# Patient Record
Sex: Male | Born: 1985 | Race: Black or African American | Hispanic: No | Marital: Married | Smoking: Never smoker
Health system: Southern US, Community
[De-identification: ages and names within clinical notes are randomized; demographics above are authoritative.]

## PROBLEM LIST (undated history)

## (undated) HISTORY — PX: HERNIA REPAIR: SHX51

## (undated) HISTORY — PX: FOOT SURGERY: SHX648

---
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
## 2010-09-28 ENCOUNTER — Emergency Department (HOSPITAL_COMMUNITY): Admission: EM | Admit: 2010-09-28 | Discharge: 2010-07-18 | Payer: Self-pay | Admitting: Emergency Medicine

## 2011-02-02 ENCOUNTER — Emergency Department (HOSPITAL_COMMUNITY)
Admission: EM | Admit: 2011-02-02 | Discharge: 2011-02-02 | Discharge status: Against Medical Advice | Payer: Self-pay | Attending: Emergency Medicine | Admitting: Emergency Medicine

## 2011-02-02 ENCOUNTER — Emergency Department (HOSPITAL_COMMUNITY): Payer: Self-pay

## 2011-02-02 DIAGNOSIS — W219XXA Striking against or struck by unspecified sports equipment, initial encounter: Secondary | ICD-10-CM | POA: Insufficient documentation

## 2011-02-02 DIAGNOSIS — S0993XA Unspecified injury of face, initial encounter: Secondary | ICD-10-CM | POA: Insufficient documentation

## 2011-02-02 DIAGNOSIS — S199XXA Unspecified injury of neck, initial encounter: Secondary | ICD-10-CM | POA: Insufficient documentation

## 2011-02-02 DIAGNOSIS — Y9367 Activity, basketball: Secondary | ICD-10-CM | POA: Insufficient documentation

## 2011-02-03 ENCOUNTER — Emergency Department (HOSPITAL_COMMUNITY): Payer: Self-pay

## 2011-02-03 ENCOUNTER — Emergency Department (HOSPITAL_COMMUNITY)
Admission: EM | Admit: 2011-02-03 | Discharge: 2011-02-03 | Disposition: A | Discharge status: Home / Self Care | Payer: Self-pay | Attending: Emergency Medicine | Admitting: Emergency Medicine

## 2011-02-03 DIAGNOSIS — J3489 Other specified disorders of nose and nasal sinuses: Secondary | ICD-10-CM | POA: Insufficient documentation

## 2011-02-03 DIAGNOSIS — W219XXA Striking against or struck by unspecified sports equipment, initial encounter: Secondary | ICD-10-CM | POA: Insufficient documentation

## 2011-02-03 DIAGNOSIS — Y929 Unspecified place or not applicable: Secondary | ICD-10-CM | POA: Insufficient documentation

## 2011-02-03 DIAGNOSIS — R51 Headache: Secondary | ICD-10-CM | POA: Insufficient documentation

## 2011-02-03 DIAGNOSIS — R220 Localized swelling, mass and lump, head: Secondary | ICD-10-CM | POA: Insufficient documentation

## 2011-02-03 DIAGNOSIS — S022XXA Fracture of nasal bones, initial encounter for closed fracture: Secondary | ICD-10-CM | POA: Insufficient documentation

## 2011-02-03 DIAGNOSIS — J45909 Unspecified asthma, uncomplicated: Secondary | ICD-10-CM | POA: Insufficient documentation

## 2011-02-03 DIAGNOSIS — Y9367 Activity, basketball: Secondary | ICD-10-CM | POA: Insufficient documentation

## 2011-03-09 NOTE — Op Note (Signed)
NAMEODES, LOLLI              ACCOUNT NO.:  [id number]   MEDICAL RECORD NO.:  [id number]          PATIENT TYPE:  AMB   LOCATION:  DSC                          FACILITY:  [facility]   PHYSICIAN:  Robert A. Thurston Hole, M.D. DATE OF BIRTH:  02/22/1986   DATE OF PROCEDURE:  10/30/2005  DATE OF DISCHARGE:                                 OPERATIVE REPORT   PREOPERATIVE DIAGNOSIS:  Right fifth metatarsal fracture.   POSTOPERATIVE DIAGNOSIS:  Right fifth metatarsal fracture.   PROCEDURE:  Open reduction internal fixation of right fifth metatarsal  fracture.   SURGEON:  Dr. Salvatore Marvel   ANESTHESIA:  General.   OPERATIVE TIME:  40 minutes.   COMPLICATIONS:  None.   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 He is now to undergo ORIF of this.   DESCRIPTION:  Slayton was brought to operating room on October 30, 2005,  placed on operative table in supine position. After adequate level of  general anesthesia was obtained, he received Ancef 1 gram IV preoperatively  for prophylaxis. His right foot and leg was prepped using sterile DuraPrep  and draped using sterile technique.  Using fluoroscopic guidance through a 5  mm proximal incision at the base of fifth metatarsal, initial exposure was  made.  The underlying subcutaneous tissues were incised longitudinally. The  guide pin from the cannulated 4.5 mm screw set was placed through the tip of  the fifth metatarsal and under fluoroscopic control, it was drilled distally  down the fifth metatarsal shaft across the fracture site and into the fifth  metatarsal shaft distally. AP and lateral fluoroscopic x-rays confirmed  satisfactory position of the pin. The pin was measured, found to be 60 mm in  length and then was overdrilled with a 2.7 mm drill and then a 60 mm x 4.5  mm cannulated screw was  screwed into position with a firm and tight  fixation, holding the fracture in a reduced and anatomic position. AP and  lateral fluoroscopic x-rays confirmed this. After this was done, the pin was  removed. The wound irrigated and closed with interrupted 4-0 nylon suture  and injected with 0.25%  Marcaine. Sterile dressings were applied. The  patient awakened and taken to recovery in stable condition.   FOLLOW-UP CARE:  Asmar will be followed as an outpatient on Vicodin for  pain. See him back in the office in a week for sutures out and follow-up.      Robert A. Thurston Hole, M.D.  Electronically Signed     RAW/MEDQ  D:  10/30/2005  T:  10/31/2005  Job:  [id number]

## 2013-07-16 ENCOUNTER — Encounter (HOSPITAL_COMMUNITY): Payer: Self-pay | Admitting: Nurse Practitioner

## 2013-07-16 ENCOUNTER — Emergency Department (HOSPITAL_COMMUNITY): Payer: Self-pay

## 2013-07-16 ENCOUNTER — Emergency Department (HOSPITAL_COMMUNITY)
Admission: EM | Admit: 2013-07-16 | Discharge: 2013-07-16 | Disposition: A | Discharge status: Home / Self Care | Payer: Self-pay | Attending: Emergency Medicine | Admitting: Emergency Medicine

## 2013-07-16 DIAGNOSIS — X500XXA Overexertion from strenuous movement or load, initial encounter: Secondary | ICD-10-CM | POA: Insufficient documentation

## 2013-07-16 DIAGNOSIS — Y9367 Activity, basketball: Secondary | ICD-10-CM | POA: Insufficient documentation

## 2013-07-16 DIAGNOSIS — S8992XA Unspecified injury of left lower leg, initial encounter: Secondary | ICD-10-CM

## 2013-07-16 DIAGNOSIS — S8990XA Unspecified injury of unspecified lower leg, initial encounter: Secondary | ICD-10-CM | POA: Insufficient documentation

## 2013-07-16 DIAGNOSIS — Y9239 Other specified sports and athletic area as the place of occurrence of the external cause: Secondary | ICD-10-CM | POA: Insufficient documentation

## 2013-07-16 MED ORDER — HYDROCODONE-ACETAMINOPHEN 5-325 MG PO TABS: 1.0000 | ORAL_TABLET | ORAL | Status: DC | PRN

## 2013-07-16 MED ORDER — NAPROXEN 375 MG PO TABS: 375.0000 mg | ORAL_TABLET | Freq: Two times a day (BID) | ORAL | Status: DC

## 2013-07-16 NOTE — ED Provider Notes (Signed)
CSN: [id number]     Arrival date & time 07/16/13  1716 History  This chart was scribed for non-physician practitioner, Marlon Pel, PA-C working with Junius Argyle, MD by Greggory Stallion, ED scribe. This patient was seen in room TR11C/TR11C and the patient's care was started at 7:05 PM.   Chief Complaint  Patient presents with  . Knee Injury   The history is provided by the patient. No language interpreter was used.    HPI Comments: Sergio Wilson is a 27 y.o. male who presents to the Emergency Department complaining of left knee injury that occurred this morning. Pt states he was playing basketball when his left knee popped to the side and has been hurting since then. He states ambulation and bending worsens the pain. Pt has put on a compression wrap and used ice with mild relief. He denies any other associated symptoms.   History reviewed. No pertinent past medical history. History reviewed. No pertinent past surgical history. History reviewed. No pertinent family history. History  Substance Use Topics  . Smoking status: Never Smoker   . Smokeless tobacco: Not on file  . Alcohol Use: No    Review of Systems  Musculoskeletal: Positive for arthralgias.  All other systems reviewed and are negative.    Allergies  Review of patient's allergies indicates no known allergies.  Home Medications   Current Outpatient Rx  Name  Route  Sig  Dispense  Refill  . ibuprofen (ADVIL,MOTRIN) 200 MG tablet   Oral   Take 200 mg by mouth every 6 (six) hours as needed for pain.         . Tetrahydrozoline HCl (VISINE OP)   Both Eyes   Place 2 drops into both eyes as needed (red eyes).         Marland Kitchen HYDROcodone-acetaminophen (NORCO/VICODIN) 5-325 MG per tablet   Oral   Take 1-2 tablets by mouth every 4 (four) hours as needed for pain.   20 tablet   0   . naproxen (NAPROSYN) 375 MG tablet   Oral   Take 1 tablet (375 mg total) by mouth 2 (two) times daily.   20 tablet   0    BP  139/74  Pulse 56  Temp(Src) 97.8 F (36.6 C) (Oral)  Resp 16  SpO2 97%  Physical Exam  Nursing note and vitals reviewed. Constitutional: He is oriented to person, place, and time. He appears well-developed and well-nourished. No distress.  HENT:  Head: Normocephalic and atraumatic.  Eyes: EOM are normal.  Neck: Neck supple. No tracheal deviation present.  Cardiovascular: Normal rate.   Pulmonary/Chest: Effort normal. No respiratory distress.  Musculoskeletal: Normal range of motion.  Tenderness on later side of left knee. Decreased ROM due to pain.   Neurological: He is alert and oriented to person, place, and time.  Strength 5/5.   Skin: Skin is warm and dry.  Psychiatric: He has a normal mood and affect. His behavior is normal.    ED Course  Procedures (including critical care time)  DIAGNOSTIC STUDIES: Oxygen Saturation is 97% on RA, normal by my interpretation.    COORDINATION OF CARE: 7:08 PM-Discussed treatment plan which includes crutches, antiinflammatory, pain medication, ice and rest with pt at bedside and pt agreed to plan. Advised pt to follow up with orthopaedics if pain continues in one week.   Labs Review Labs Reviewed - No data to display Imaging Review Dg Knee Complete 4 Views Left  07/16/2013   *RADIOLOGY REPORT*  Clinical Data: Left knee pain after injury.  LEFT KNEE - COMPLETE 4+ VIEW  Comparison: None.  Findings: No fracture or dislocation is noted.  Joint spaces are intact. No soft tissue abnormality is noted.  No joint effusion is noted.  IMPRESSION: Normal left knee.   Original Report Authenticated By: Lupita Raider.,  M.D.    MDM   1. Knee injury, left, initial encounter    27 y.o.Sergio Wilson's evaluation in the Emergency Department is complete. It has been determined that no acute conditions requiring further emergency intervention are present at this time. The patient/guardian have been advised of the diagnosis and plan. We have discussed  signs and symptoms that warrant return to the ED, such as changes or worsening in symptoms.  Vital signs are stable at discharge. Filed Vitals:   07/16/13 1732  BP: 139/74  Pulse: 56  Temp: 97.8 F (36.6 C)  Resp: 16    Patient/guardian has voiced understanding and agreed to follow-up with the PCP or specialist.  I personally performed the services described in this documentation, which was scribed in my presence. The recorded information has been reviewed and is accurate.   Dorthula Matas, PA-C 07/16/13 1931

## 2013-07-16 NOTE — ED Notes (Addendum)
Pt was playing basketball this am and he felt his L knee "pop to the side." since then, he has been having L knee pain especially with ambulation. Cms intact. Pt applied compression wrap and ice

## 2013-07-17 NOTE — ED Provider Notes (Signed)
Medical screening examination/treatment/procedure(s) were performed by non-physician practitioner and as supervising physician I was immediately available for consultation/collaboration.   Damyen Knoll S Muad Noga, MD 07/17/13 1428 

## 2014-11-14 IMAGING — CR DG KNEE COMPLETE 4+V*L*
4 series · 4 of 4 positions shown · non-contrast
Comparison: None.

CLINICAL DATA: Left knee pain after injury.

LEFT KNEE - COMPLETE 4+ VIEW

[t knee ap left]
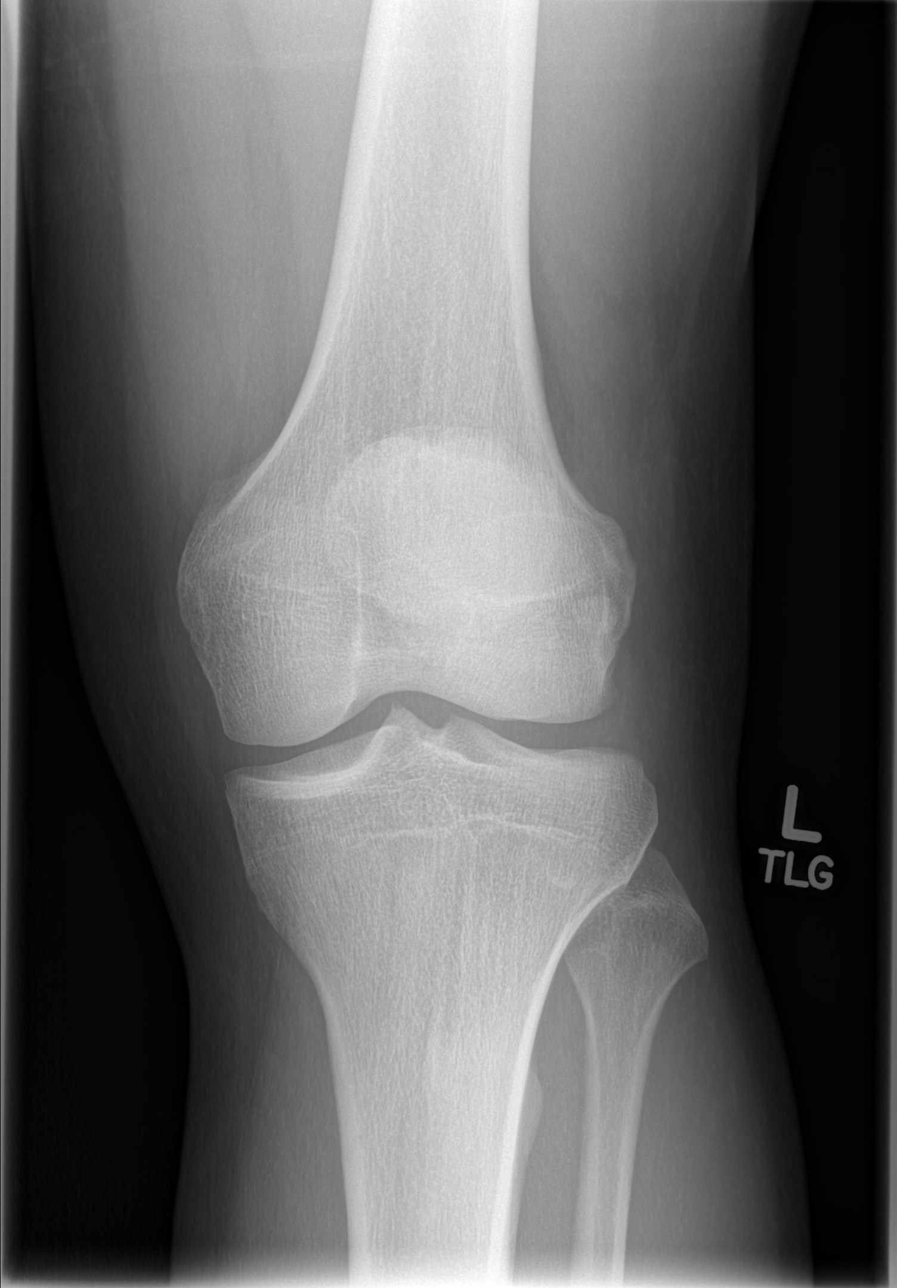

[t knee oblique left (1 of 2)]
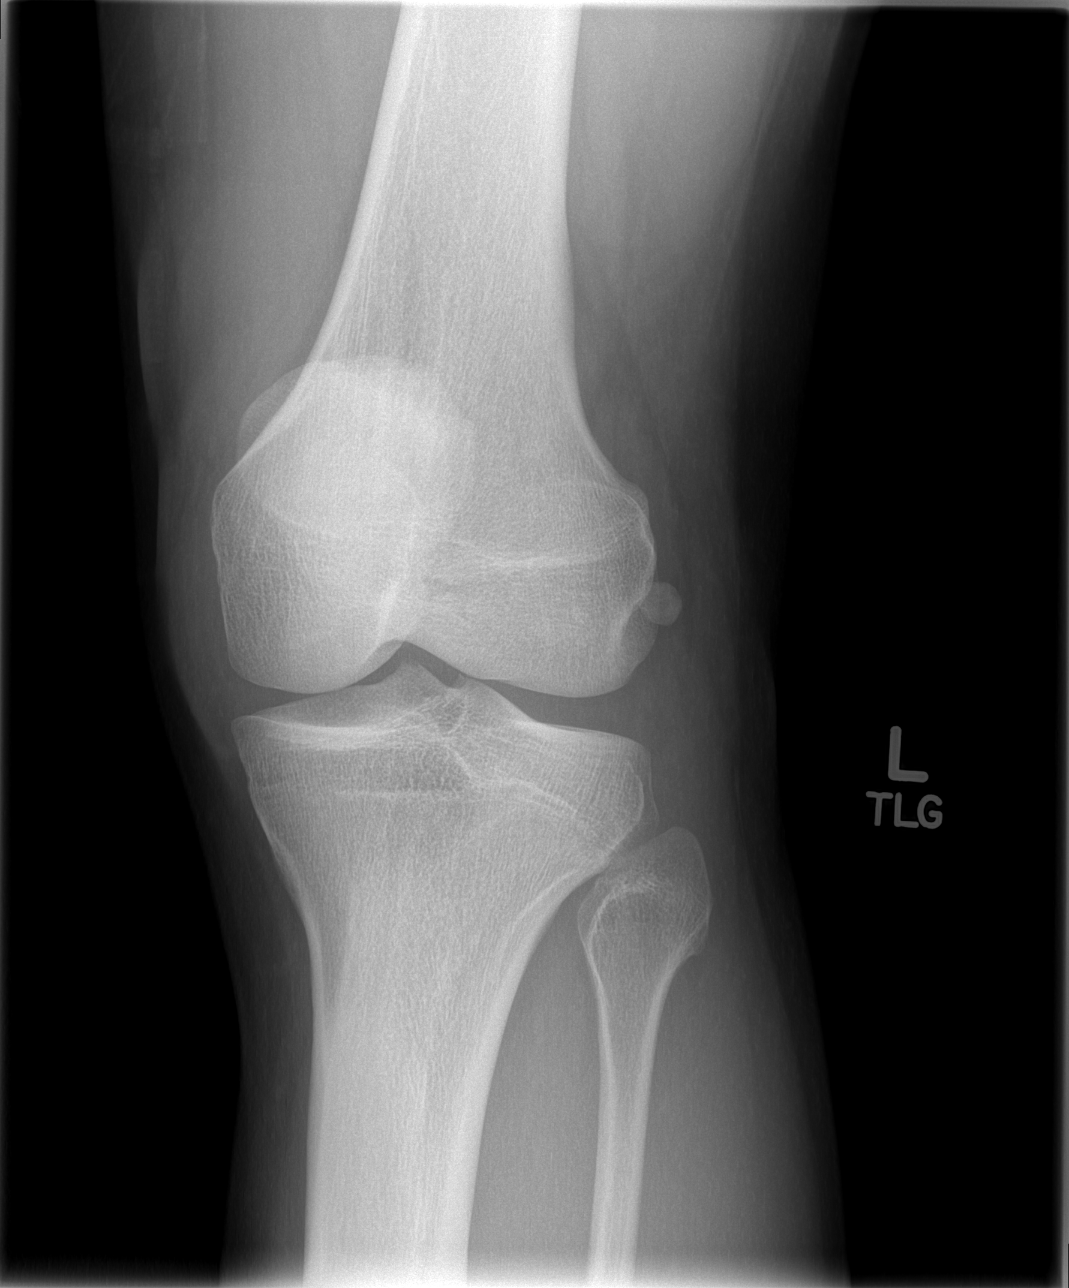

[t knee oblique left (2 of 2)]
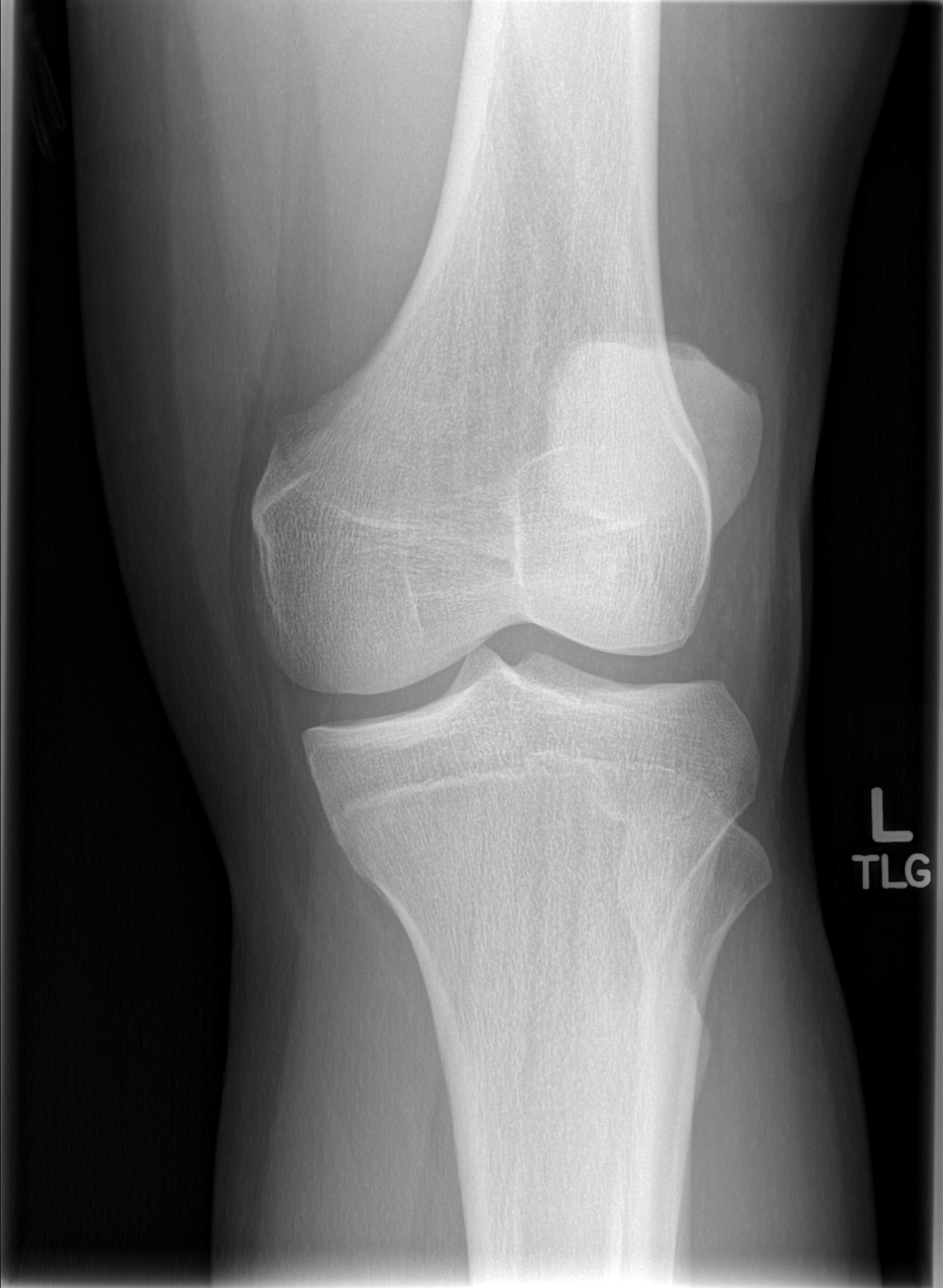

[t knee lat left]
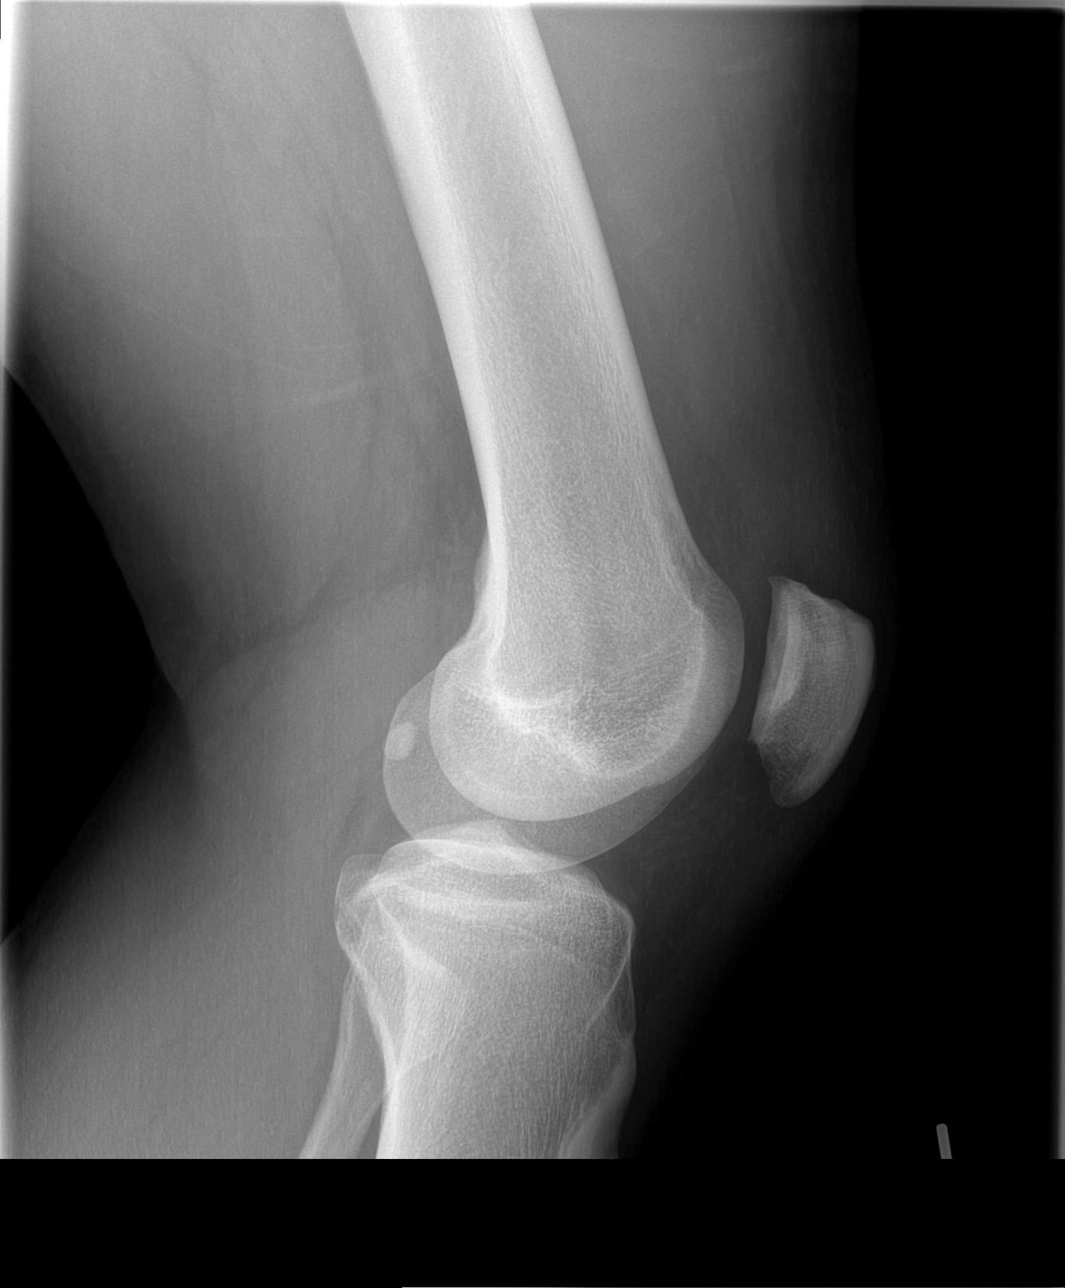

[4 of 4 positions shown; findings below may reference images not displayed]

FINDINGS: No fracture or dislocation is noted.  Joint spaces are
intact. No soft tissue abnormality is noted.  No joint effusion is
noted.
IMPRESSION: Normal left knee.

## 2017-02-17 ENCOUNTER — Emergency Department (HOSPITAL_COMMUNITY)
Admission: EM | Admit: 2017-02-17 | Discharge: 2017-02-17 | Disposition: A | Discharge status: Home / Self Care | Payer: Self-pay | Attending: Emergency Medicine | Admitting: Emergency Medicine

## 2017-02-17 ENCOUNTER — Encounter (HOSPITAL_COMMUNITY): Payer: Self-pay | Admitting: Emergency Medicine

## 2017-02-17 DIAGNOSIS — K0889 Other specified disorders of teeth and supporting structures: Secondary | ICD-10-CM | POA: Insufficient documentation

## 2017-02-17 MED ORDER — PENICILLIN V POTASSIUM 250 MG PO TABS
500.0000 mg | ORAL_TABLET | Freq: Once | ORAL | Status: AC
  Administered 2017-02-17: 500 mg via ORAL
  Filled 2017-02-17: qty 2

## 2017-02-17 MED ORDER — PENICILLIN V POTASSIUM 500 MG PO TABS: 500.0000 mg | ORAL_TABLET | Freq: Four times a day (QID) | ORAL | 0 refills | Status: DC

## 2017-02-17 NOTE — ED Notes (Signed)
Patient ambulatory to the room.  No distress noted.

## 2017-02-17 NOTE — ED Notes (Signed)
Patient Alert and oriented X4. Stable and ambulatory. Patient verbalized understanding of the discharge instructions.  Patient belongings were taken by the patient.  

## 2017-02-17 NOTE — ED Provider Notes (Signed)
[facility]-EMERGENCY DEPT Provider Note   CSN: [id number] Arrival date & time: 02/17/17  1959  By signing my name below, I, Doreatha Martin, attest that this documentation has been prepared under the direction and in the presence of Roxy Horseman, PA-C. Electronically Signed: Doreatha Martin, ED Scribe. 02/17/17. 8:54 PM.    History   Chief Complaint Chief Complaint  Patient presents with  . Dental Pain    HPI Sergio CHARLOT is a 31 y.o. male who presents to the Emergency Department complaining of moderate, constant left-sided dental pain that began this week with associated left-sided facial swelling. He reports his current symptoms are similar to prior dental abscesses. He states his pain is worsened with eating. No alleviating factors noted. He denies difficulty swallowing, fever.    The history is provided by the patient. No language interpreter was used.    History reviewed. No pertinent past medical history.  There are no active problems to display for this patient.   Past Surgical History:  Procedure Laterality Date  . FOOT SURGERY    . HERNIA REPAIR         Home Medications    Prior to Admission medications   Medication Sig Start Date End Date Taking? Authorizing Provider  HYDROcodone-acetaminophen (NORCO/VICODIN) 5-325 MG per tablet Take 1-2 tablets by mouth every 4 (four) hours as needed for pain. 07/16/13   Tiffany Neva Seat, PA-C  ibuprofen (ADVIL,MOTRIN) 200 MG tablet Take 200 mg by mouth every 6 (six) hours as needed for pain.    Historical Provider, MD  naproxen (NAPROSYN) 375 MG tablet Take 1 tablet (375 mg total) by mouth 2 (two) times daily. 07/16/13   Tiffany Neva Seat, PA-C  Tetrahydrozoline HCl (VISINE OP) Place 2 drops into both eyes as needed (red eyes).    Historical Provider, MD    Family History No family history on file.  Social History Social History  Substance Use Topics  . Smoking status: Never Smoker  . Smokeless tobacco: Never Used  . Alcohol use No      Allergies   Patient has no known allergies.   Review of Systems Review of Systems  Constitutional: Negative for fever.  HENT: Positive for dental problem and facial swelling. Negative for trouble swallowing.     Physical Exam Updated Vital Signs BP 131/87 (BP Location: Right Arm)   Pulse 94   Temp 98.6 F (37 C) (Oral)   Resp 18   Ht  (1.93 m)   Wt 245 lb (111.1 kg)   SpO2 100%   BMI 29.82 kg/m   Physical Exam Physical Exam  Constitutional: Pt appears well-developed and well-nourished.  HENT:  Head: Normocephalic.  Right Ear: Tympanic membrane, external ear and ear canal normal.  Left Ear: Tympanic membrane, external ear and ear canal normal.  Nose: Nose normal. Right sinus exhibits no maxillary sinus tenderness and no frontal sinus tenderness. Left sinus exhibits no maxillary sinus tenderness and no frontal sinus tenderness.  Mouth/Throat: Uvula is midline, oropharynx is clear and moist and mucous membranes are normal. No oral lesions. No uvula swelling or lacerations. No oropharyngeal exudate, posterior oropharyngeal edema, posterior oropharyngeal erythema or tonsillar abscesses.  Poor dentition No gingival swelling, fluctuance or induration No gross abscess  No sublingual edema, tenderness to palpation, or sign of Ludwig's angina, or deep space infection Pain at left lower rear molar Eyes: Conjunctivae are normal. Pupils are equal, round, and reactive to light. Right eye exhibits no discharge. Left eye exhibits no discharge.  Neck:  Normal range of motion. Neck supple.  No stridor Handling secretions without difficulty No nuchal rigidity No cervical lymphadenopathy Cardiovascular: Normal rate, regular rhythm and normal heart sounds.   Pulmonary/Chest: Effort normal. No respiratory distress.  Equal chest rise  Abdominal: Soft. Bowel sounds are normal. Pt exhibits no distension. There is no tenderness.  Lymphadenopathy: Pt has no cervical adenopathy.    Neurological: Pt is alert and oriented x 4  Skin: Skin is warm and dry.  Psychiatric: Pt has a normal mood and affect.  Nursing note and vitals reviewed.    ED Treatments / Results   DIAGNOSTIC STUDIES: Oxygen Saturation is 100% on RA, normal by my interpretation.    COORDINATION OF CARE: 8:53 PM Discussed treatment plan with pt at bedside which includes antibiotics and pt agreed to plan.   Procedures Procedures (including critical care time)  Medications Ordered in ED Medications - No data to display   Initial Impression / Assessment and Plan / ED Course  I have reviewed the triage vital signs and the nursing notes.     Patient with dentalgia. On exam, there is no evidence of a drainable abscess. No trismus, glossal elevation, unilateral tonsillar swelling. No evidence of retropharyngeal or peritonsillar abscess or Ludwig angina. Will treat with Penicillin. Pt instructed to follow-up with dentist. Resource guide provided with AVS. Discussed return precautions. Pt safe for discharge. Pt is agreeable to plan.     Final Clinical Impressions(s) / ED Diagnoses   Final diagnoses:  Pain, dental    New Prescriptions Discharge Medication List as of 02/17/2017  8:56 PM    START taking these medications   Details  penicillin v potassium (VEETID) 500 MG tablet Take 1 tablet (500 mg total) by mouth 4 (four) times daily., Starting Sun 02/17/2017, Print        I personally performed the services described in this documentation, which was scribed in my presence. The recorded information has been reviewed and is accurate.      Roxy Horseman, PA-C 02/17/17 2210    Shaune Pollack, MD 02/19/17 [phone]

## 2017-02-17 NOTE — ED Triage Notes (Addendum)
Pt c/o L lower dental pain, +swelling to gumline and jaw. pt states he had a molar break "awhile ago". Pt has not been seen by a dentist for this issue. Pt has taken copious amounts of APAP, Motrin and used oral numbing agent without relief.  Denies fever, chills

## 2018-11-17 ENCOUNTER — Ambulatory Visit: Payer: Self-pay | Admitting: Sports Medicine

## 2019-06-07 ENCOUNTER — Emergency Department (HOSPITAL_COMMUNITY)
Admission: EM | Admit: 2019-06-07 | Discharge: 2019-06-07 | Disposition: A | Discharge status: Home / Self Care | Payer: Managed Care, Other (non HMO) | Attending: Emergency Medicine | Admitting: Emergency Medicine

## 2019-06-07 ENCOUNTER — Encounter (HOSPITAL_COMMUNITY): Payer: Self-pay | Admitting: Emergency Medicine

## 2019-06-07 ENCOUNTER — Other Ambulatory Visit: Payer: Self-pay

## 2019-06-07 DIAGNOSIS — K0889 Other specified disorders of teeth and supporting structures: Secondary | ICD-10-CM | POA: Diagnosis present

## 2019-06-07 DIAGNOSIS — Z5321 Procedure and treatment not carried out due to patient leaving prior to being seen by health care provider: Secondary | ICD-10-CM | POA: Insufficient documentation

## 2019-06-07 NOTE — ED Notes (Signed)
PT states he is leaving.  Explained delays again and encouraged him to wait.  Pt declines.

## 2019-06-07 NOTE — ED Notes (Signed)
Pt states he is leaving due to wait.  Explained wait for treatment room and pt states he will wait.

## 2019-06-07 NOTE — ED Triage Notes (Signed)
C/o R lower dental pain x 2-3 days.

## 2019-06-18 ENCOUNTER — Other Ambulatory Visit: Payer: Self-pay

## 2019-06-18 ENCOUNTER — Emergency Department (HOSPITAL_COMMUNITY)
Admission: EM | Admit: 2019-06-18 | Discharge: 2019-06-19 | Disposition: A | Discharge status: Home / Self Care | Payer: Managed Care, Other (non HMO) | Attending: Emergency Medicine | Admitting: Emergency Medicine

## 2019-06-18 ENCOUNTER — Encounter (HOSPITAL_COMMUNITY): Payer: Self-pay | Admitting: Emergency Medicine

## 2019-06-18 DIAGNOSIS — K0889 Other specified disorders of teeth and supporting structures: Secondary | ICD-10-CM | POA: Diagnosis present

## 2019-06-18 DIAGNOSIS — K047 Periapical abscess without sinus: Secondary | ICD-10-CM | POA: Insufficient documentation

## 2019-06-18 DIAGNOSIS — Z79899 Other long term (current) drug therapy: Secondary | ICD-10-CM | POA: Insufficient documentation

## 2019-06-18 MED ORDER — PENICILLIN V POTASSIUM 500 MG PO TABS: 500.0000 mg | ORAL_TABLET | Freq: Four times a day (QID) | ORAL | 0 refills | Status: DC

## 2019-06-18 MED ORDER — PENICILLIN V POTASSIUM 500 MG PO TABS
500.0000 mg | ORAL_TABLET | Freq: Once | ORAL | Status: AC
  Administered 2019-06-19: 500 mg via ORAL
  Filled 2019-06-18: qty 1

## 2019-06-18 NOTE — ED Provider Notes (Signed)
[facility] Note   CSN: [id number] Arrival date & time: 06/18/19  2247     History   Chief Complaint Chief Complaint  Patient presents with  . Dental Pain    HPI Sergio Wilson is a 33 y.o. male.     The history is provided by the patient and medical records.     33 year old male here with right lower dental pain.  States has been bothering him over the past 2 days.  Reports similar issues in the past related to dental abscess.  Feels like he may be getting some swelling in his cheek.  He denies any difficulty swallowing, sore throat, fever, or chills.  He is not currently established with a dentist.  He has been taking some pain medication that he had leftover at home, was initially helping but is not any longer.  History reviewed. No pertinent past medical history.  There are no active problems to display for this patient.   Past Surgical History:  Procedure Laterality Date  . FOOT SURGERY    . HERNIA REPAIR          Home Medications    Prior to Admission medications   Medication Sig Start Date End Date Taking? Authorizing Provider  HYDROcodone-acetaminophen (NORCO/VICODIN) 5-325 MG per tablet Take 1-2 tablets by mouth every 4 (four) hours as needed for pain. 07/16/13   Marlon PelGreene, Tiffany, PA-C  ibuprofen (ADVIL,MOTRIN) 200 MG tablet Take 200 mg by mouth every 6 (six) hours as needed for pain.    [provider]  naproxen (NAPROSYN) 375 MG tablet Take 1 tablet (375 mg total) by mouth 2 (two) times daily. 07/16/13   Marlon PelGreene, Tiffany, PA-C  penicillin v potassium (VEETID) 500 MG tablet Take 1 tablet (500 mg total) by mouth 4 (four) times daily. 02/17/17   Roxy HorsemanBrowning, Robert, PA-C  Tetrahydrozoline HCl (VISINE OP) Place 2 drops into both eyes as needed (red eyes).    [provider]    Family History No family history on file.  Social History Social History   Tobacco Use  . Smoking status: Never Smoker   . Smokeless tobacco: Never Used  Substance Use Topics  . Alcohol use: No  . Drug use: Yes    Types: Marijuana     Allergies   Patient has no known allergies.   Review of Systems Review of Systems  HENT: Positive for dental problem.   All other systems reviewed and are negative.    Physical Exam Updated Vital Signs BP (!) 181/94 (BP Location: Left Arm)   Pulse 66   Temp 98.2 F (36.8 C) (Oral)   Resp 18   SpO2 100%   Physical Exam Vitals signs and nursing note reviewed.  Constitutional:      Appearance: He is well-developed.  HENT:     Head: Normocephalic and atraumatic.     Mouth/Throat:     Comments: Teeth largely in fair dentition, right lower molar is broken and decayed, surrounding gingiva mild swelling without discrete abscess, handling secretions appropriately, no trismus, no facial or neck swelling, normal phonation without stridor Eyes:     Conjunctiva/sclera: Conjunctivae normal.     Pupils: Pupils are equal, round, and reactive to light.  Neck:     Musculoskeletal: Normal range of motion.  Cardiovascular:     Rate and Rhythm: Normal rate and regular rhythm.     Heart sounds: Normal heart sounds.  Pulmonary:     Effort: Pulmonary effort  is normal.     Breath sounds: Normal breath sounds.  Abdominal:     General: Bowel sounds are normal.     Palpations: Abdomen is soft.  Musculoskeletal: Normal range of motion.  Skin:    General: Skin is warm and dry.  Neurological:     Mental Status: He is alert and oriented to person, place, and time.      ED Treatments / Results  Labs (all labs ordered are listed, but only abnormal results are displayed) Labs Reviewed - No data to display  EKG None  Radiology No results found.  Procedures Procedures (including critical care time)  Medications Ordered in ED Medications  penicillin v potassium (VEETID) tablet 500 mg (has no administration in time range)     Initial Impression / Assessment and  Plan / ED Course  I have reviewed the triage vital signs and the nursing notes.  Pertinent labs & imaging results that were available during my care of the patient were reviewed by me and considered in my medical decision making (see chart for details).  33 year old male here with right lower dental pain.  Appears to have developing infection but no discrete abscess at this time.  He is afebrile and nontoxic.  He is handling secretions well, normal phonation without stridor.  No signs or symptoms concerning for Ludwig's angina.  Plan to start oral antibiotics and have him follow-up with dentist-- given information for dentist on call as well as resource guide.  Return here for any new or acute changes.  Final Clinical Impressions(s) / ED Diagnoses   Final diagnoses:  Dental infection  Pain, dental    ED Discharge Orders         Ordered    penicillin v potassium (VEETID) 500 MG tablet  4 times daily     06/18/19 2350           Larene Pickett, PA-C 06/18/19 2359    Fatima Blank, MD 06/20/19 [phone]

## 2019-06-18 NOTE — ED Notes (Signed)
ED Provider at bedside. 

## 2019-06-18 NOTE — Discharge Instructions (Signed)
Take the prescribed medication as directed. Follow-up with dentist.  Dr. Haig Prophet is on call for the hospital, can try to get appt with him or see one of the clinics on resource guide I have attached. Return to the ED for new or worsening symptoms.

## 2019-06-18 NOTE — ED Triage Notes (Signed)
Pt c/o R lower dental pain x 3 days. Seen for same last week.

## 2023-02-17 ENCOUNTER — Encounter (HOSPITAL_COMMUNITY): Payer: Self-pay

## 2023-02-17 ENCOUNTER — Other Ambulatory Visit: Payer: Self-pay

## 2023-02-17 ENCOUNTER — Emergency Department (HOSPITAL_COMMUNITY)
Admission: EM | Admit: 2023-02-17 | Discharge: 2023-02-18 | Disposition: A | Discharge status: Home / Self Care | Payer: BC Managed Care – PPO | Attending: Emergency Medicine | Admitting: Emergency Medicine

## 2023-02-17 ENCOUNTER — Ambulatory Visit (HOSPITAL_COMMUNITY)
Admission: EM | Admit: 2023-02-17 | Discharge: 2023-02-17 | Disposition: A | Discharge status: Home / Self Care | Payer: Self-pay

## 2023-02-17 ENCOUNTER — Emergency Department (HOSPITAL_COMMUNITY): Payer: BC Managed Care – PPO

## 2023-02-17 DIAGNOSIS — R799 Abnormal finding of blood chemistry, unspecified: Secondary | ICD-10-CM | POA: Insufficient documentation

## 2023-02-17 DIAGNOSIS — R791 Abnormal coagulation profile: Secondary | ICD-10-CM | POA: Insufficient documentation

## 2023-02-17 DIAGNOSIS — J45909 Unspecified asthma, uncomplicated: Secondary | ICD-10-CM | POA: Insufficient documentation

## 2023-02-17 DIAGNOSIS — R4182 Altered mental status, unspecified: Secondary | ICD-10-CM | POA: Insufficient documentation

## 2023-02-17 DIAGNOSIS — F1290 Cannabis use, unspecified, uncomplicated: Secondary | ICD-10-CM | POA: Diagnosis not present

## 2023-02-17 DIAGNOSIS — F05 Delirium due to known physiological condition: Secondary | ICD-10-CM

## 2023-02-17 LAB — URINALYSIS, ROUTINE W REFLEX MICROSCOPIC
Bilirubin Urine: NEGATIVE
Glucose, UA: NEGATIVE mg/dL
Hgb urine dipstick: NEGATIVE
Ketones, ur: NEGATIVE mg/dL
Leukocytes,Ua: NEGATIVE
Nitrite: NEGATIVE
Protein, ur: NEGATIVE mg/dL
Specific Gravity, Urine: 1.025 (ref 1.005–1.030)
pH: 5 (ref 5.0–8.0)

## 2023-02-17 LAB — RAPID URINE DRUG SCREEN, HOSP PERFORMED
Amphetamines: NOT DETECTED
Barbiturates: NOT DETECTED
Benzodiazepines: NOT DETECTED
Cocaine: NOT DETECTED
Opiates: NOT DETECTED
Tetrahydrocannabinol: POSITIVE — AB

## 2023-02-17 LAB — COMPREHENSIVE METABOLIC PANEL
ALT: 38 U/L (ref 0–44)
AST: 27 U/L (ref 15–41)
Albumin: 4.4 g/dL (ref 3.5–5.0)
Alkaline Phosphatase: 82 U/L (ref 38–126)
Anion gap: 8 (ref 5–15)
BUN: 11 mg/dL (ref 6–20)
CO2: 23 mmol/L (ref 22–32)
Calcium: 9.2 mg/dL (ref 8.9–10.3)
Chloride: 104 mmol/L (ref 98–111)
Creatinine, Ser: 1.08 mg/dL (ref 0.61–1.24)
GFR, Estimated: >60 mL/min (ref >60)
Glucose, Bld: 100 mg/dL — ABNORMAL HIGH (ref 70–99)
Potassium: 4.2 mmol/L (ref 3.5–5.1)
Sodium: 135 mmol/L (ref 135–145)
Total Bilirubin: 0.8 mg/dL (ref 0.3–1.2)
Total Protein: 7.8 g/dL (ref 6.5–8.1)

## 2023-02-17 LAB — DIFFERENTIAL
Abs Immature Granulocytes: 0.05 10*3/uL (ref 0.00–0.07)
Basophils Absolute: 0 10*3/uL (ref 0.0–0.1)
Basophils Relative: 0 %
Eosinophils Absolute: 0.1 10*3/uL (ref 0.0–0.5)
Eosinophils Relative: 1 %
Immature Granulocytes: 1 %
Lymphocytes Relative: 22 %
Lymphs Abs: 2.2 10*3/uL (ref 0.7–4.0)
Monocytes Absolute: 0.4 10*3/uL (ref 0.1–1.0)
Monocytes Relative: 4 %
Neutro Abs: 7.1 10*3/uL (ref 1.7–7.7)
Neutrophils Relative %: 72 %

## 2023-02-17 LAB — PROTIME-INR
INR: 1 (ref 0.8–1.2)
Prothrombin Time: 12.9 seconds (ref 11.4–15.2)

## 2023-02-17 LAB — I-STAT CHEM 8, ED
BUN: 15 mg/dL (ref 6–20)
Calcium, Ion: 1.15 mmol/L (ref 1.15–1.40)
Chloride: 104 mmol/L (ref 98–111)
Creatinine, Ser: 1.1 mg/dL (ref 0.61–1.24)
Glucose, Bld: 97 mg/dL (ref 70–99)
HCT: 47 % (ref 39.0–52.0)
Hemoglobin: 16 g/dL (ref 13.0–17.0)
Potassium: 4.5 mmol/L (ref 3.5–5.1)
Sodium: 137 mmol/L (ref 135–145)
TCO2: 25 mmol/L (ref 22–32)

## 2023-02-17 LAB — CBC
HCT: 44.7 % (ref 39.0–52.0)
Hemoglobin: 15.3 g/dL (ref 13.0–17.0)
MCH: 32 pg (ref 26.0–34.0)
MCHC: 34.2 g/dL (ref 30.0–36.0)
MCV: 93.5 fL (ref 80.0–100.0)
Platelets: 241 10*3/uL (ref 150–400)
RBC: 4.78 MIL/uL (ref 4.22–5.81)
RDW: 12.8 % (ref 11.5–15.5)
WBC: 9.9 10*3/uL (ref 4.0–10.5)
nRBC: 0 % (ref 0.0–0.2)

## 2023-02-17 LAB — TSH: TSH: 0.951 u[IU]/mL (ref 0.350–4.500)

## 2023-02-17 LAB — COOXEMETRY PANEL
Carboxyhemoglobin: 1.2 % (ref 0.5–1.5)
Methemoglobin: 0.9 % (ref 0.0–1.5)
O2 Saturation: 53.4 %
Total hemoglobin: 15.8 g/dL (ref 12.0–16.0)

## 2023-02-17 LAB — APTT: aPTT: 31 seconds (ref 24–36)

## 2023-02-17 LAB — CBG MONITORING, ED
Glucose-Capillary: 113 mg/dL — ABNORMAL HIGH (ref 70–99)
Glucose-Capillary: 98 mg/dL (ref 70–99)

## 2023-02-17 LAB — AMMONIA: Ammonia: 40 umol/L — ABNORMAL HIGH (ref 9–35)

## 2023-02-17 LAB — ETHANOL: Alcohol, Ethyl (B): <10 mg/dL (ref <10)

## 2023-02-17 MED ORDER — GADOBUTROL 1 MMOL/ML IV SOLN
10.0000 mL | Freq: Once | INTRAVENOUS | Status: AC | PRN
  Administered 2023-02-17: 10 mL via INTRAVENOUS

## 2023-02-17 MED ORDER — SODIUM CHLORIDE 0.9% FLUSH: 3.0000 mL | Freq: Once | INTRAVENOUS | Status: DC

## 2023-02-17 NOTE — ED Provider Notes (Signed)
Patient brought into the urgent care with mother for complaints of disorientation and generalized weakness.  Patient is a poor historian, slow to answer questions.  Mother reports this is out of his norm, has a history of asthma, denies any history of mental illness.  Patient does endorse marijuana use, last smoked prior to going to work today.  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  Vitals were stable in triage, patient able to answer questions, however, slow.  Advised immediate transport to the nearest emergency department for further evaluation and potential imaging.  Mother will take patient via personal vehicle.    Sergio Wilson, [place], [place] 02/17/23 [phone]

## 2023-02-17 NOTE — ED Triage Notes (Addendum)
Pt brought here by his mother.  She states that his coworkers called her and stated that he was acting very strange.  He was c/o a headache and could not remember what was going on.  Pt, who is normally ao x 4, is not alert to day, time or situation.  Only can state his name.  Pt did use marijuana last night.  Denies injury.  Denies pain or nausea presently.    No facial droop or loss of limb function.

## 2023-02-17 NOTE — ED Notes (Signed)
Patient ambulated to bathroom independently.

## 2023-02-17 NOTE — ED Provider Notes (Signed)
[place] EMERGENCY DEPARTMENT AT [facility] Provider Note  Medical Decision Making   HPI: Sergio Wilson is a 37 y.o. male with history perinent asthma, marijuana use who presents complaining of altered mental status. Patient arrived via POV by mother.  History provided by patient, mother.  No interpreter required for this encounter.  Patient denies any complaints, knows name, date of birth, however believes that it is 2020, repeatedly asks what day of the week it is and what the date is.  Endorses marijuana use, denies any complications with marijuana previously, endorses most recent marijuana use last night.  Denies any headache, neck pain, neck stiffness, fevers, chills, nausea, vomiting, diarrhea, chest pain, shortness of breath, photophobia, dysuria.  Mother at bedside states that patient received all childhood vaccinations, only medical history is asthma, no medications for this.  Reports patient was in his normal state of health yesterday, states that they both work at [employer], have reports that she got a call from him this morning around 9 where patient asked her how to make mashed potatoes because he had forgotten.  She thought he was drinking, therefore she ended the call, however approximately an hour later she got a call from coworkers at [employer] who stated that the patient was confused, did not know where he was, and did not know how to cook any of the food.  Therefore she brought him to the emergency department for further evaluation.  Denies any known recent trauma, falls, reports that there is a family history of schizophrenia and a paternal uncle to the best of her knowledge.   ROS: As per HPI. Please see MAR for complete past medical history, surgical history, and social history.   Physical exam is pertinent for disorientation, no other findings.   The differential includes but is not limited to intoxication, infection, meningitis, encephalitis, trauma,  ICH, mass lesion, transient global amnesia, seizures, hepatic encephalopathy, uremia, monoxide poisoning, UTI, hypoglycemia, DKA, thyroid storm, myxedema coma, organic psychiatric illness  Additional history obtained from: Mother at bedside External records from outside source obtained and reviewed including: None  ED provider interpretation of ECG: Rate 58, sinus rhythm, no ST elevations or depressions, nonspecific T wave flattening in lead aVL, V1.  ED provider interpretation of radiology/imaging: CT head without ICH, displaced fracture, loss of gray/white matter differentiation.  MRI without mass lesion, vasogenic edema, or hyperintensities  Labs ordered were interpreted by myself as well as my attending and were incorporated into the medical decision making process for this patient.  ED provider interpretation of labs: BC without leukocytosis, anemia, thrombocytopenia.  CMP without AKI or emergent electrolyte derangement, coags WNL, TSH WNL, coags without any patient of carboxyhemoglobin or methemoglobin.  UA without UTI, ammonia mildly elevated at 40, glucose WNL.  UDS positive for THC consistent with patient's history of marijuana use.  Interventions: None  See the EMR for full details regarding lab and imaging results.  On exam patient is pleasant and cooperative, however not oriented to time, has blunted affect, stares blankly at wall for several minutes at a time, is slow to respond to questions, for example when asked to look at my nose during pupillary exam patient says I do not see one, turns to the side, then 30 seconds later says OU wearing a mask, is very slow to recognize things, however patient does not seem clinically intoxicated more so bizarre affect, ethanol negative, UDS only positive for [facility] which patient endorses using.  Doubt infection, patient  without any localizing symptoms on exam, no leukocytosis, no fever, negative Kernig's, Brudzinski's, no photophobia, doubt  meningitis, encephalitis, patient without any external signs concerning for trauma, CT head negative.  Patient without any seizure-like activity, ammonia minimally elevated, doubt hepatic encephalopathy, doubt uremia given normal BUN/creatinine.  Doubt carbon monoxide poisoning given normal coags.  No evidence of UTI, no hypoglycemia or hyperglycemia, TSH normal doubt thyroid storm, myxedema coma.  Possible organic psychiatric illness, however presentation not classic for any given psychiatric illness, rapid onset is inconsistent.  Possible total global amnesia.  Overall, consulted neurology for evaluation, I spoke with Dr. Iver Nestle, she recommended MRI brain with and without contrast as well as EEG.  MRI WNL.  At time of handoff patient is awaiting EEG, plan follow-up this, touch base with neurology, disposition as per recommendations.   Consults: neurology  Disposition: HANDOFF: At the time of signout, the patients EEG had not yet been completed. I transferred care of the patient at the time of signout to [place], [place]. I informed the incoming care provider of the patient's history, status, and management plan. I addressed all of their concerns and/or questions to the best of my ability. Please refer to the incoming care provider's note for details regarding the remainder of the patient's ED course and disposition.  The plan for this patient was discussed with Dr. Suezanne Jacquet, who voiced agreement and who oversaw evaluation and treatment of this patient.  Clinical Impression:  1. Altered mental status, unspecified altered mental status type    Data Unavailable  Therapies: These medications and interventions were provided for the patient while in the ED. Medications  sodium chloride flush (NS) 0.9 % injection 3 mL (3 mLs Intravenous Not Given 02/17/23 1831)  gadobutrol (GADAVIST) 1 MMOL/ML injection 10 mL (10 mLs Intravenous Contrast Given 02/17/23 2341)    MDM generated using voice dictation  software and may contain dictation errors.  Please contact me for any clarification or with any questions.  Clinical Complexity A medically appropriate history, review of systems, and physical exam was performed.  Collateral history obtained from: Mother I personally reviewed the labs, EKG, imaging as discussed above. Patient's presentation is most consistent with acute complicated illness / injury requiring diagnostic workup Treatment: Pending Medications: None Discussed patient's care with providers from the following different specialties: Neurology  Physical Exam   ED Triage Vitals  Enc Vitals Group     BP 02/17/23 1637 (!) 140/76     Pulse Rate 02/17/23 1637 71     Resp 02/17/23 1637 16     Temp 02/17/23 1634 99.4 F (37.4 C)     Temp Source 02/17/23 1634 Oral     SpO2 02/17/23 1637 100 %     Weight 02/17/23 1636 250 lb (113.4 kg)     Height 02/17/23 1636 6\' 4"  (1.93 m)     Head Circumference --      Peak Flow --      Pain Score 02/17/23 1636 0     Pain Loc --      Pain Edu? --      Excl. in GC? --      Physical Exam Vitals and nursing note reviewed.  Constitutional:      General: He is not in acute distress.    Appearance: He is well-developed.  HENT:     Head: Normocephalic and atraumatic.     Nose: Nose normal.     Mouth/Throat:     Mouth: Mucous membranes are moist.  Eyes:     Extraocular Movements: Extraocular movements intact.     Conjunctiva/sclera: Conjunctivae normal.     Pupils: Pupils are equal, round, and reactive to light.     Comments: No photophobia  Neck:     Comments: Negative Kernig's, negative Brudzinski's Cardiovascular:     Rate and Rhythm: Normal rate and regular rhythm.     Pulses: Normal pulses.     Heart sounds: No murmur heard. Pulmonary:     Effort: Pulmonary effort is normal. No respiratory distress.     Breath sounds: Normal breath sounds.  Abdominal:     General: Abdomen is flat. Bowel sounds are normal.     Palpations:  Abdomen is soft.     Tenderness: There is no abdominal tenderness. There is no guarding or rebound.  Musculoskeletal:        General: No swelling, tenderness, deformity or signs of injury. Normal range of motion.     Cervical back: Neck supple. No rigidity, tenderness or bony tenderness.     Thoracic back: No bony tenderness.     Lumbar back: No bony tenderness.  Lymphadenopathy:     Cervical: No cervical adenopathy.  Skin:    General: Skin is warm and dry.     Capillary Refill: Capillary refill takes less than 2 seconds.  Neurological:     Mental Status: He is alert. He is disoriented.     GCS: GCS eye subscore is 4. GCS verbal subscore is 5. GCS motor subscore is 6.     Cranial Nerves: No cranial nerve deficit or facial asymmetry.     Sensory: No sensory deficit.     Motor: No weakness.     Coordination: Coordination normal.     Gait: Gait normal.  Psychiatric:        Mood and Affect: Mood normal.       Procedure Note  Procedures  MR BRAIN W WO CONTRAST  Final Result    CT HEAD WO CONTRAST  Final Result      Julianne Rice, MD Emergency Medicine, PGY-2   Curley Spice, MD 02/18/23 0211    Lonell Grandchild, MD 02/19/23 1147

## 2023-02-17 NOTE — ED Notes (Signed)
Patient transported to MRI 

## 2023-02-17 NOTE — ED Notes (Signed)
Patient transported to CT 

## 2023-02-18 ENCOUNTER — Emergency Department (HOSPITAL_COMMUNITY): Payer: BC Managed Care – PPO

## 2023-02-18 DIAGNOSIS — R569 Unspecified convulsions: Secondary | ICD-10-CM

## 2023-02-18 DIAGNOSIS — R4182 Altered mental status, unspecified: Secondary | ICD-10-CM

## 2023-02-18 LAB — RPR: RPR Ser Ql: NONREACTIVE

## 2023-02-18 LAB — HEMOGLOBIN A1C
Hgb A1c MFr Bld: 5.6 % (ref 4.8–5.6)
Mean Plasma Glucose: 114.02 mg/dL

## 2023-02-18 LAB — HIV ANTIBODY (ROUTINE TESTING W REFLEX): HIV Screen 4th Generation wRfx: NONREACTIVE

## 2023-02-18 LAB — VITAMIN B12: Vitamin B-12: 258 pg/mL (ref 180–914)

## 2023-02-18 LAB — SEDIMENTATION RATE: Sed Rate: 1 mm/hr (ref 0–16)

## 2023-02-18 NOTE — Discharge Instructions (Addendum)
Your workup in the ED including imaging of your brain and EEG to evaluate for seizures was reassuring. Please see your doctor in the next 3 days for follow-up of your symptoms.  Return if you have weakness in your arms or legs, slurred speech, trouble walking or talking, confusion, or trouble with your balance.

## 2023-02-18 NOTE — Procedures (Signed)
Patient Name: Sergio Wilson  MRN: [id number]  Epilepsy Attending: Charlsie Quest  Referring Physician/Provider: Curley Spice, MD  Date: 02/18/2023 Duration: 28.27 mins  Patient history: 37 year old presenting with acute confusion lasting most of the day. EEG to evaluate for seizure  Level of alertness: Awake, asleep  AEDs during EEG study: None  Technical aspects: This EEG study was done with scalp electrodes positioned according to the 10-20 International system of electrode placement. Electrical activity was reviewed with band pass filter of 1-70Hz , sensitivity of 7 uV/mm, display speed of 41mm/sec with a 60Hz  notched filter applied as appropriate. EEG data were recorded continuously and digitally stored.  Video monitoring was available and reviewed as appropriate.  Description: The posterior dominant rhythm consists of 9 Hz activity of moderate voltage (25-35 uV) seen predominantly in posterior head regions, symmetric and reactive to eye opening and eye closing. Sleep was characterized by vertex waves, sleep spindles (12 to 14 Hz), maximal frontocentral region. Hyperventilation did not show any EEG change.  Physiologic photic driving was not seen during photic stimulation.    IMPRESSION: This study is within normal limits. No seizures or epileptiform discharges were seen throughout the recording.  A normal interictal EEG does not exclude the diagnosis of epilepsy.  Sergio Wilson

## 2023-02-18 NOTE — ED Provider Notes (Signed)
Signout from [employer] at shift change. Briefly, patient presents for confusion.  Workup including imaging has been reassuring.  Patient has received neuroconsult.  They recommended EEG which is pending.   Plan: Follow-up EEG reports.   11:42 AM Reassessment performed. Patient appears stable.  Labs and imaging personally reviewed and interpreted including: CBC unremarkable; CMP unremarkable; normal TSH; negative ethanol; UA without signs of infection; UDS positive for THC; ammonia 40 minimally elevated.  CT and MRI brain negative.   EEG returned without any concerning findings.   Most current vital signs reviewed and are as follows: BP 122/73   Pulse 62   Temp 98 F (36.7 C) (Oral)   Resp 14   Ht 6\' 4"  (1.93 m)   Wt 113.4 kg   SpO2 99%   BMI 30.43 kg/m   Plan: Discharge.   Return and follow-up instructions: Encouraged return to ED with worsening synptoms. Encouraged patient to follow-up with their provider in 3 days. Patient verbalized understanding and agreed with plan.        Renne Crigler, PA-C 02/18/23 1144    Kommor, Wyn Forster, MD 02/18/23 2030

## 2023-02-18 NOTE — ED Provider Notes (Signed)
Received at shift change from Curley Spice MD please see her note for full detail  In short patient without significant medical history presenting with complaints of forgetfulness.  Patient states that yesterday after he smoked marijuana he went to work and states that he forgot everything that happened.  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  He states that he does not remember coming to the emergency department.  He states upon arrival at the emergency department he now remembers everything and he states that he is back to his baseline.  At that time of his forgetfulness he was not having any headaches change in vision paresthesias or weakness in the upper or lower extremities, he denies any recent head trauma, not on any anticoags, and endorsing fevers chills no associated neck pain.  He states that he does smoke marijuana states that he smoked his normal amount and use the marijuana that he typically uses.  Per previous provider follow-up on EEG for further evaluation   Physical Exam  BP 126/74   Pulse 68   Temp 98.5 F (36.9 C) (Axillary)   Resp 18   Ht 6\' 4"  (1.93 m)   Wt 113.4 kg   SpO2 99%   BMI 30.43 kg/m   Physical Exam Vitals and nursing note reviewed.  Constitutional:      General: He is not in acute distress.    Appearance: He is not ill-appearing.  HENT:     Head: Normocephalic and atraumatic.     Nose: No congestion.  Eyes:     Conjunctiva/sclera: Conjunctivae normal.  Cardiovascular:     Rate and Rhythm: Normal rate and regular rhythm.     Pulses: Normal pulses.     Heart sounds: No murmur heard.    No friction rub. No gallop.  Pulmonary:     Effort: No respiratory distress.     Breath sounds: No wheezing, rhonchi or rales.  Skin:    General: Skin is warm and dry.  Neurological:     Mental Status: He is alert.     GCS: GCS eye subscore is 4. GCS verbal subscore  is 5. GCS motor subscore is 6.     Cranial Nerves: Cranial nerves 2-12 are intact.     Sensory: Sensation is intact.     Motor: No weakness.     Coordination: Romberg sign negative. Finger-Nose-Finger Test normal.     Comments: Cranial nerves II through XII grossly intact no difficulty with word finding following two-step commands there is no unilateral weakness present.  Patient is alert oriented x 4, able to identify common objects like a pen and a cell phone, he is able to tell me the day of the week the president how many quarters make up a dollar and the reason why he came to the emergency department.  Psychiatric:        Mood and Affect: Mood normal.     Procedures  Procedures  ED Course / MDM   Clinical Course as of 02/18/23 0323  Sun Feb 17, 2023  1906 Talk to neuro, labs unrevealing [LS]  2109 MRI, EEG [LS]    Clinical Course User Index [LS] Curley Spice, MD   Medical Decision Making Amount and/or Complexity of Data Reviewed Labs: ordered. Radiology: ordered.  Risk Prescription drug management.    Lab Tests:  I Ordered, and personally interpreted labs.  The pertinent results  include: CBC unremarkable, CMP reveals glucose of 100, prothrombin time and INR unremarkable, APTT unremarkable, TSH unremarkable, drug screen positive for cannabis, UA unremarkable, ammonia 40, ethanol negative,   Imaging Studies ordered:  I ordered imaging studies including MRI brain with and without, CT head I independently visualized and interpreted imaging which showed all negative for significant findings I agree with the radiologist interpretation   Cardiac Monitoring:  The patient was maintained on a cardiac monitor.  I personally viewed and interpreted the cardiac monitored which showed an underlying rhythm of: Sinus signs of ischemia   Medicines ordered and prescription drug management:  I ordered medication including N/A I have reviewed the patients home medicines and  have made adjustments as needed  Critical Interventions:  N/A   Reevaluation:  Upon assessment patient he was at his baseline and having no complaints.  Will await EEG results.    Consultations Obtained:  I requested consultation with the Dr. Nathaneil Canary,  and discussed lab and imaging findings as well as pertinent plan - they recommend: Personally spoke with neurologist, she recommends awaiting EEG, she will place in initial workup, if unremarkable patient can be discharged home and follow-up with outpatient neurology if positive patient will possibly need to be admitted please see her consult note for full detail    Dispostion and problem list  Due to shift change patient being handed off to Dr. Emmit [facility]ers [facility]  Follow-up on EEG and discharge accordingly.           Carroll Sage, PA-C 02/18/23 1610    Nicanor Alcon, April, MD 02/18/23 [phone]

## 2023-02-18 NOTE — ED Notes (Signed)
Neuro MD at bedside

## 2023-02-18 NOTE — Consult Note (Signed)
Neurology Consultation Reason for Consult: Altered mental status Requesting Physician: Alvino Blood  CC: Confusion/disorientation  History is obtained from: Chart review, mother, patient  HPI: Sergio Wilson is a 37 y.o. male with a past medical history significant for asthma, daily marijuana use.  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  He notes he cannot remember almost any details from 9 AM to 5 PM today, other than around 2 PM or so when he was driving after leaving the facility -- he remembers his mom calling and telling him to pull over because she had heard from other family members that he was confused.  He does not remember her subsequently arriving to pick him up ordered the details of his arrival here, although he can now recall details that were provided to him  In brief he was slow to respond and had repetitive questioning, including not being certain of the date repeatedly.  He has no seizure risk factors on review of systems including no family history of seizures, no personal history of seizures, normal birth and development, no history of meningitis/encephalitis, no history of concussion (although he did play football he does not feel he ever had a significant head injury).  He reports he has always had a short fuse and his marijuana use has not changed recently with regards to amount of intake (1 to 2 g a day) nor supplier.  His mother thinks he may be more easily agitated for the last 3 months, but he does not feel that he has had any personality change himself.  He denies any and explained incontinence or tongue bites or other episodes of confusion prior to today  Premorbid modified rankin scale:      0 - No symptoms.     ROS: All other review of systems was negative except as noted in the HPI.   History reviewed. No pertinent past medical history. See HPI above for pertinent history  History reviewed.  No pertinent family history. Negative family history of seizures as detailed above   Social History:  reports that he has never smoked. He has never used smokeless tobacco. He reports current drug use. Drug: Marijuana. He reports that he does not drink alcohol.   Exam: Current vital signs: BP 126/74   Pulse 68   Temp 98.5 F (36.9 C) (Axillary)   Resp 18   Ht 6\' 4"  (1.93 m)   Wt 113.4 kg   SpO2 99%   BMI 30.43 kg/m  Vital signs in last 24 hours: Temp:  [98.3 F (36.8 C)-99.4 F (37.4 C)] 98.5 F (36.9 C) (04/29 0227) Pulse Rate:  [60-71] 68 (04/29 0200) Resp:  [16-19] 18 (04/29 0200) BP: (113-140)/(57-92) 126/74 (04/29 0200) SpO2:  [98 %-100 %] 99 % (04/29 0200) Weight:  [113.4 kg] 113.4 kg (04/28 1636)   Physical Exam  Constitutional: Appears well-developed and well-nourished.  Muscular Psych: Affect appropriate to situation, pleasant, cooperative Eyes: Mild scleral injection bilaterally HENT: No oropharyngeal obstruction.  MSK: no joint deformities.  Cardiovascular: Normal rate and regular rhythm. Perfusing extremities well Respiratory: Effort normal, non-labored breathing GI: Soft.  No distension. There is no tenderness.  Skin: Warm dry and intact visible skin  Neuro: Mental Status: Patient is awake, alert, oriented to person, place, month, year, and situation. Patient is able to give a clear and coherent history. No signs of aphasia or neglect Cranial Nerves: II: Visual Fields are full.  Pupils are equal, round, and reactive to light.   III,IV, VI: EOMI without ptosis or diploplia.  V: Facial sensation is reduced to temperature and light touch on the right side in the V2 distribution only VII: Facial movement is symmetric.  VIII: hearing is intact to voice X: Uvula elevates symmetrically XI: Shoulder shrug is symmetric. XII: tongue is midline without atrophy or fasciculations.  Motor: Tone is normal. Bulk is normal. 5/5 strength was present in all four  extremities.  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  2+ bilateral patellae Cerebellar: FNF and HKS are intact bilaterally Gait:  Deferred in acute setting    I have reviewed labs in epic and the results pertinent to this consultation are:  Basic Metabolic Panel: Recent Labs  Lab 02/17/23 1645 02/17/23 1651  NA 135 137  K 4.2 4.5  CL 104 104  CO2 23  --   GLUCOSE 100* 97  BUN 11 15  CREATININE 1.08 1.10  CALCIUM 9.2  --     CBC: Recent Labs  Lab 02/17/23 1645 02/17/23 1651  WBC 9.9  --   NEUTROABS 7.1  --   HGB 15.3 16.0  HCT 44.7 47.0  MCV 93.5  --   PLT 241  --     Coagulation Studies: Recent Labs    02/17/23 1645  LABPROT 12.9  INR 1.0    Mildly elevated ammonia at 40 TSH within normal limits at 0.951  UA negative including negative for proteinuria  I have reviewed the images obtained: MRI brain personally reviewed, agree with radiology:   Normal brain MRI. No acute intracranial abnormality identified.   Impression: 37 year old presenting with acute confusion lasting most of the day.  Symptoms potentially consistent with transient global amnesia.  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  MRI brain completed on my recommendation is reassuring against structural abnormality such as seizure focus.  EEG should also be obtained.  I will start initial workup for neuropathy which can be followed up by outpatient neurologist.  An alternative etiology for symptoms could be secondary to marijuana use although given he denies any change in his use patterns this would be a diagnosis of exclusion  Recommendations: -MRI brain -Initial workup of neuropathy to include A1c, B12, ANA, ESR, HIV, RPR -Routine EEG -If routine EEG is reassuring, outpatient referral to neurology for follow-up (within 3-6  months) -Inpatient neurology will follow-up EEG but otherwise will be available as needed going forward, please reach out if any additional questions or concerns arise  Brooke Dare MD-PhD [facility] [phone] Available 7 PM to 7 AM, outside of these hours please call Neurologist on call as listed on Amion.

## 2023-02-18 NOTE — Progress Notes (Signed)
EEG complete - results pending 

## 2023-02-19 LAB — ENA+DNA/DS+ANTICH+CENTRO+JO...
Anti JO-1: <0.2 AI (ref 0.0–0.9)
Centromere Ab Screen: <0.2 AI (ref 0.0–0.9)
Chromatin Ab SerPl-aCnc: <0.2 AI (ref 0.0–0.9)
ENA SM Ab Ser-aCnc: <0.2 AI (ref 0.0–0.9)
Ribonucleic Protein: <0.2 AI (ref 0.0–0.9)
SSA (Ro) (ENA) Antibody, IgG: >8 AI — ABNORMAL HIGH (ref 0.0–0.9)
SSB (La) (ENA) Antibody, IgG: 0.9 AI (ref 0.0–0.9)
Scleroderma (Scl-70) (ENA) Antibody, IgG: <0.2 AI (ref 0.0–0.9)
ds DNA Ab: 4 IU/mL (ref 0–9)

## 2023-02-19 LAB — ANA W/REFLEX IF POSITIVE: Anti Nuclear Antibody (ANA): POSITIVE — AB

## 2023-02-24 ENCOUNTER — Emergency Department (HOSPITAL_COMMUNITY)
Admission: EM | Admit: 2023-02-24 | Discharge: 2023-03-23 | Disposition: E | Payer: Self-pay | Attending: Emergency Medicine | Admitting: Emergency Medicine

## 2023-02-24 DIAGNOSIS — R55 Syncope and collapse: Secondary | ICD-10-CM | POA: Insufficient documentation

## 2023-02-24 DIAGNOSIS — I469 Cardiac arrest, cause unspecified: Secondary | ICD-10-CM | POA: Insufficient documentation

## 2023-02-24 LAB — CBG MONITORING, ED: Glucose-Capillary: 314 mg/dL — ABNORMAL HIGH (ref 70–99)

## 2023-02-24 MED ORDER — EPINEPHRINE 1 MG/10ML IJ SOSY
PREFILLED_SYRINGE | INTRAMUSCULAR | Status: AC | PRN
  Administered 2023-02-24: 1 mg via INTRAVENOUS

## 2023-02-24 MED ORDER — CALCIUM CHLORIDE 10 % IV SOLN
INTRAVENOUS | Status: AC | PRN
  Administered 2023-02-24 (×2): 1 g via INTRAVENOUS

## 2023-02-24 MED ORDER — MAGNESIUM SULFATE 50 % IJ SOLN
INTRAMUSCULAR | Status: AC | PRN
  Administered 2023-02-24: 2 g via INTRAVENOUS

## 2023-02-24 MED ORDER — AMIODARONE IV BOLUS ONLY 150 MG/100ML
INTRAVENOUS | Status: AC | PRN
  Administered 2023-02-24: 300 mg via INTRAVENOUS

## 2023-02-24 MED ORDER — EPINEPHRINE 1 MG/10ML IJ SOSY
PREFILLED_SYRINGE | INTRAMUSCULAR | Status: AC | PRN
  Administered 2023-02-24 (×2): 1 mg via INTRAVENOUS

## 2023-02-24 MED ORDER — SODIUM BICARBONATE 8.4 % IV SOLN
INTRAVENOUS | Status: AC | PRN
  Administered 2023-02-24 (×4): 50 meq via INTRAVENOUS

## 2023-03-23 NOTE — ED Provider Notes (Signed)
[place] EMERGENCY DEPARTMENT AT [facility] Provider Note   CSN: [id number] Arrival date & time: 2023-03-22  2026     History  Chief Complaint  Patient presents with   CPR    Sergio Wilson is a 37 y.o. male.  HPI 37 year old male presents in cardiac arrest. History is primarily from EMS until the sister and girlfriend arrived later.  The patient was found unresponsive on the couch.  Later it became apparent that the girlfriend last seen him about 2 hours prior.  Prior to this he had appeared transiently agitated, similar to when he had presented to the ER for altered mental status last week.  EMS reports the patient had a heart rate in the 20s with poor respiratory effort.  He was paced initially with good capture and then went into V-fib.  He was defibrillated once and then has been in cardiac arrest ever since  Home Medications Prior to Admission medications   Not on File      Allergies    Patient has no known allergies.    Review of Systems   Review of Systems  Unable to perform ROS: Patient unresponsive    Physical Exam Updated Vital Signs BP (!) 101/12   Resp (!) 35   Ht 6\' 4"  (1.93 m)   Wt 113 kg   BMI 30.32 kg/m  Physical Exam Vitals and nursing note reviewed.  Constitutional:      Appearance: He is well-developed.     Interventions: He is intubated.  HENT:     Head: Normocephalic and atraumatic.  Pulmonary:     Effort: He is intubated.     Breath sounds: No wheezing.  Abdominal:     General: There is no distension.     Palpations: Abdomen is soft.  Skin:    General: Skin is warm and dry.  Neurological:     Mental Status: He is unresponsive.     ED Results / Procedures / Treatments   Labs (all labs ordered are listed, but only abnormal results are displayed) Labs Reviewed  CBG MONITORING, ED - Abnormal; Notable for the following components:      Result Value   Glucose-Capillary 314 (*)    All other components within normal  limits    EKG None  Radiology No results found.  Procedures CPR  Date/Time: Mar 22, 2023 11:10 PM  Performed by: Pricilla Loveless, MD Authorized by: Pricilla Loveless, MD  CPR Procedure Details:    ACLS/BLS initiated by EMS: Yes     CPR/ACLS performed in the ED: Yes     Outcome: Pt declared dead    CPR performed via ACLS guidelines under my direct supervision.  See RN documentation for details including defibrillator use, medications, doses and timing. .Critical Care  Performed by: Pricilla Loveless, MD Authorized by: Pricilla Loveless, MD   Critical care provider statement:    Critical care time (minutes):  30   Critical care time was exclusive of:  Separately billable procedures and treating other patients   Critical care was necessary to treat or prevent imminent or life-threatening deterioration of the following conditions:  Cardiac failure and circulatory failure   Critical care was time spent personally by me on the following activities:  Development of treatment plan with patient or surrogate, discussions with consultants, evaluation of patient's response to treatment, examination of patient, ordering and review of laboratory studies, ordering and review of radiographic studies, ordering and performing treatments and interventions, pulse oximetry, re-evaluation  of patient's condition and review of old charts     Medications Ordered in ED Medications  EPINEPHrine (ADRENALIN) 1 MG/10ML injection (1 mg Intravenous Given 03/22/2023 Mar 21, 2031)  amiodarone (NEXTERONE) IV bolus only 150 mg/100 mL (0 mg Intravenous Stopped 03/04/2023 2230)  magnesium sulfate (IV Push/IM) injection (2 g Intravenous Given 02/21/2023 03/20/2034)  sodium bicarbonate injection (50 mEq Intravenous Given 03/13/2023 2048-03-20)  EPINEPHrine (ADRENALIN) 1 MG/10ML injection (1 mg Intravenous Given 03/18/2023 03-20-2038)  calcium chloride injection (1 g Intravenous Given 03/12/2023 2050)    ED Course/ Medical Decision Making/ A&P                              Medical Decision Making Amount and/or Complexity of Data Reviewed Independent Historian: EMS Labs: ordered.    Details: Glucose 300.  ABG showed pH less than 6.5 but did not crossover due to severe abnormalities.  Risk Prescription drug management.   Patient presents in cardiac arrest.  Unclear original cause.  He has been in asystole since arrival.  He seems to have a sinus rhythm whenever CPR is being performed though it does seem like he might have a widened QRS complex.  He was given multiple amps of bicarb as well as multiple other medications including epinephrine (he has received 10+ total combined with EMS), magnesium, calcium.  Unfortunately nothing has been able to get return of spontaneous circulation.  After an hour of combined CPR between EMS and the ED, the patient was pronounced deceased at 03/20/53.  Sister and girlfriend are here in the emergency department so I made them aware of his unfortunate demise.  Sister called her mom.  I discussed with the medical examiner, Benay Pillow, who agrees this is an ME case.  Of note, the patient was recently in the emergency department about a week ago for "confusion" had extensive workup.  No clear cause from what I can tell.  No reported drug use.        Final Clinical Impression(s) / ED Diagnoses Final diagnoses:  Cardiac arrest [facility])    Rx / DC Orders ED Discharge Orders     None         Pricilla Loveless, MD 02/22/2023 03-21-11

## 2023-03-23 NOTE — ED Triage Notes (Signed)
Pt BIB [facility] after a witnessed arrest. Per pt's girlfriend pt was normal earlier and she left for about 2 hrs when she returned she found him unresponsive with a pulse and barely breathing. EMS arrived and stated pt was breathing about 8 times a min and his HR was 20-30. EMS started pacing the pt, lost capture and noticed pt was in V-fib. EMS shocked pt once and started CPR. Pt received 8 epis, 5 albuterol, 0.5 atrovent, 125 solumedrol and 500 NS.

## 2023-03-23 NOTE — Code Documentation (Signed)
Patient time of death occurred at March 30, 2053.

## 2023-03-23 NOTE — Progress Notes (Signed)
   03/22/23 2100  Spiritual Encounters  Type of Visit Initial  Care provided to: Family  Referral source Code page  Reason for visit Code  OnCall Visit Yes  Spiritual Framework  Family Stress Factors Loss  Interventions  Spiritual Care Interventions Made Normalization of emotions;Reflective listening;Compassionate presence;Meaning making   Ch responded to call from ED. Pt's sister and girlfriend were at the consultation room. Family was not expecting that pt would die. Ch journeyed with family in the grief process and normalized feelings. No follow-up needed at this time.

## 2023-03-23 DEATH — deceased
